# Patient Record
Sex: Female | Born: 1990 | Race: White | Hispanic: No | Marital: Single | Smoking: Never smoker
Health system: Southern US, Community
[De-identification: ages and names within clinical notes are randomized; demographics above are authoritative.]

## PROBLEM LIST (undated history)

## (undated) DIAGNOSIS — E079 Disorder of thyroid, unspecified: Secondary | ICD-10-CM

---
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
## 2008-01-31 ENCOUNTER — Ambulatory Visit: Payer: Self-pay | Admitting: Unknown Physician Specialty

## 2008-03-27 ENCOUNTER — Emergency Department: Payer: Self-pay | Admitting: Emergency Medicine

## 2008-05-01 ENCOUNTER — Ambulatory Visit: Payer: Self-pay | Admitting: Unknown Physician Specialty

## 2008-07-10 ENCOUNTER — Ambulatory Visit: Payer: Self-pay | Admitting: Unknown Physician Specialty

## 2008-08-13 ENCOUNTER — Ambulatory Visit: Payer: Self-pay | Admitting: Internal Medicine

## 2008-08-24 ENCOUNTER — Ambulatory Visit: Payer: Self-pay | Admitting: Internal Medicine

## 2008-08-24 ENCOUNTER — Ambulatory Visit: Payer: Self-pay | Admitting: Unknown Physician Specialty

## 2008-10-16 ENCOUNTER — Ambulatory Visit: Payer: Self-pay | Admitting: Unknown Physician Specialty

## 2008-12-20 ENCOUNTER — Ambulatory Visit: Payer: Self-pay | Admitting: Unknown Physician Specialty

## 2009-04-17 ENCOUNTER — Other Ambulatory Visit: Payer: Self-pay | Admitting: Unknown Physician Specialty

## 2009-10-29 ENCOUNTER — Other Ambulatory Visit: Payer: Self-pay | Admitting: Unknown Physician Specialty

## 2010-05-08 ENCOUNTER — Other Ambulatory Visit: Payer: Self-pay | Admitting: Unknown Physician Specialty

## 2010-07-05 ENCOUNTER — Ambulatory Visit: Payer: Self-pay | Admitting: Internal Medicine

## 2010-07-30 ENCOUNTER — Ambulatory Visit: Payer: Self-pay

## 2010-08-28 ENCOUNTER — Ambulatory Visit: Payer: Self-pay | Admitting: Family Medicine

## 2010-11-05 ENCOUNTER — Other Ambulatory Visit: Payer: Self-pay | Admitting: Unknown Physician Specialty

## 2011-08-25 ENCOUNTER — Other Ambulatory Visit: Payer: Self-pay

## 2011-08-25 LAB — TSH: Thyroid Stimulating Horm: 1.28 u[IU]/mL

## 2011-08-25 LAB — T4, FREE: Free Thyroxine: 1.25 ng/dL (ref 0.76–1.46)

## 2011-09-27 IMAGING — CR DG LUMBAR SPINE 2-3V
1 series · 3 of 3 positions shown · non-contrast
Comparison: none

REASON FOR EXAM: Dx back pain
COMMENTS:

PROCEDURE:     DXR - DXR LUMBAR SPINE AP AND LATERAL  - July 30, 2010 [DATE]
RESULT:     The vertebral body heights and the intervertebral disc spaces
are well maintained. The vertebral body alignment is normal. The pedicles
are bilaterally intact.

[Series 1: view not recorded · 0.17mm/px · 3 of 3 slices shown]
[im 1/3]
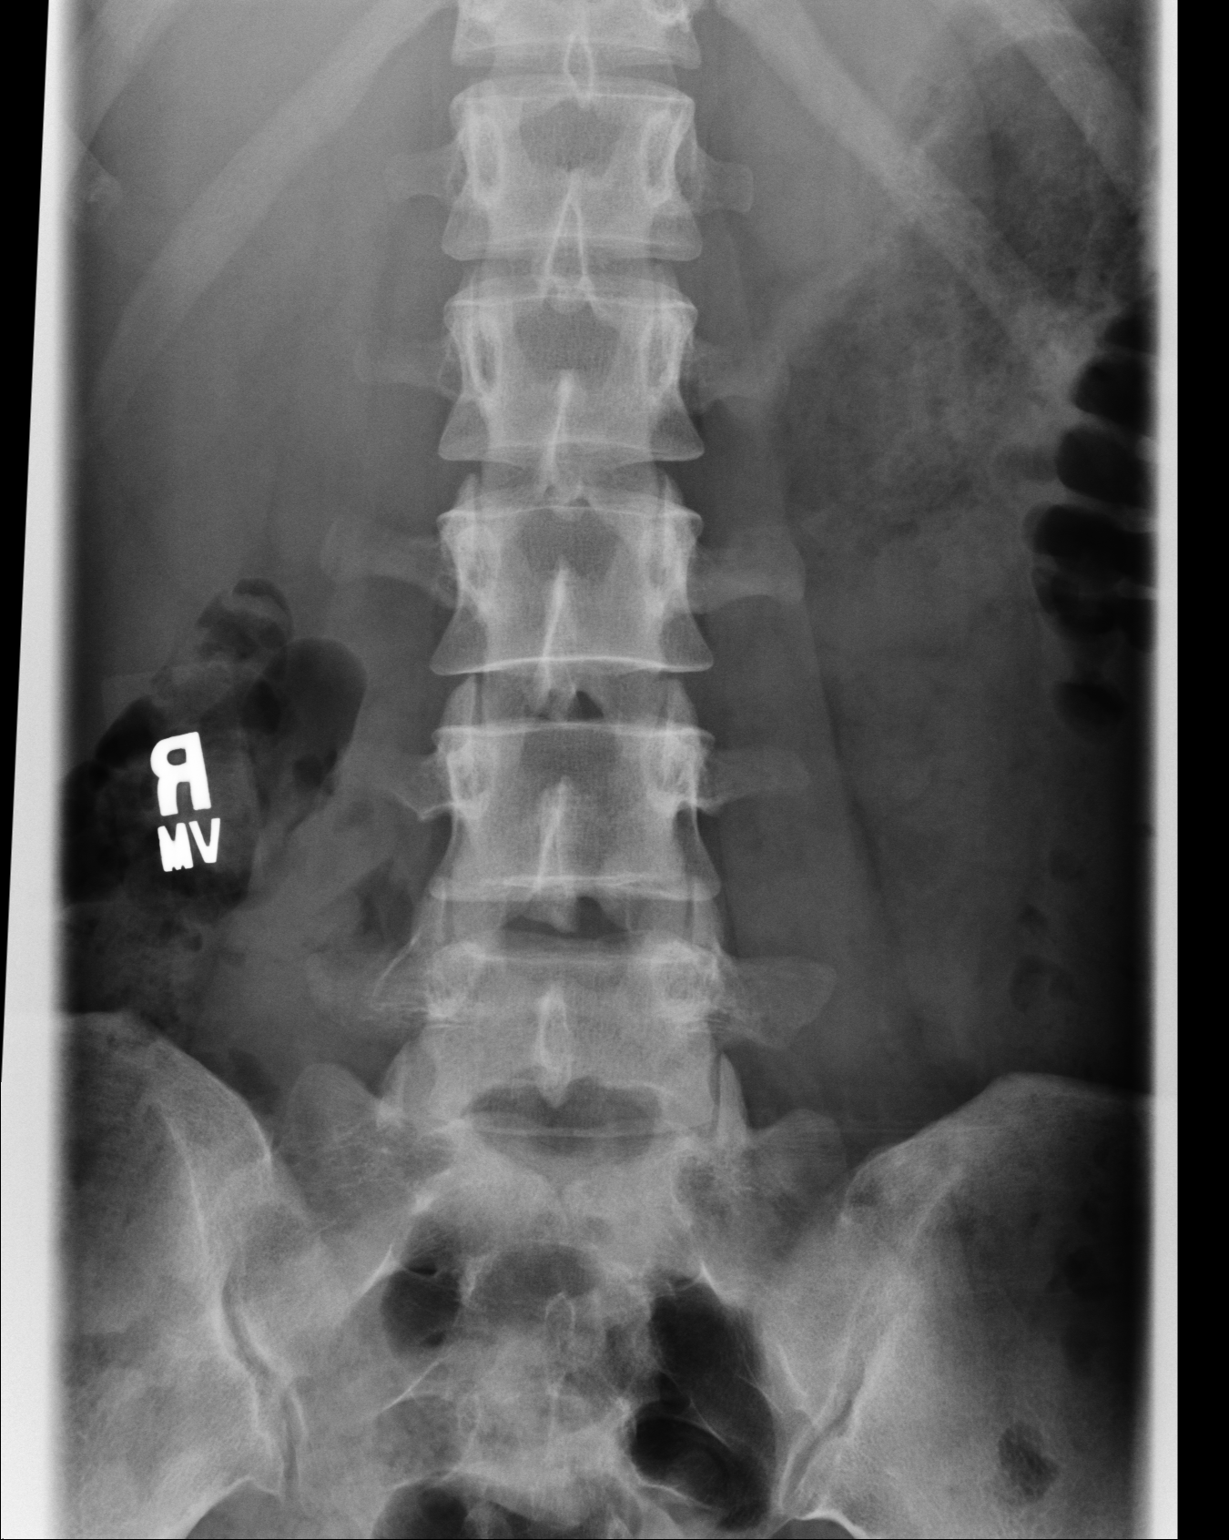
[im 2/3]
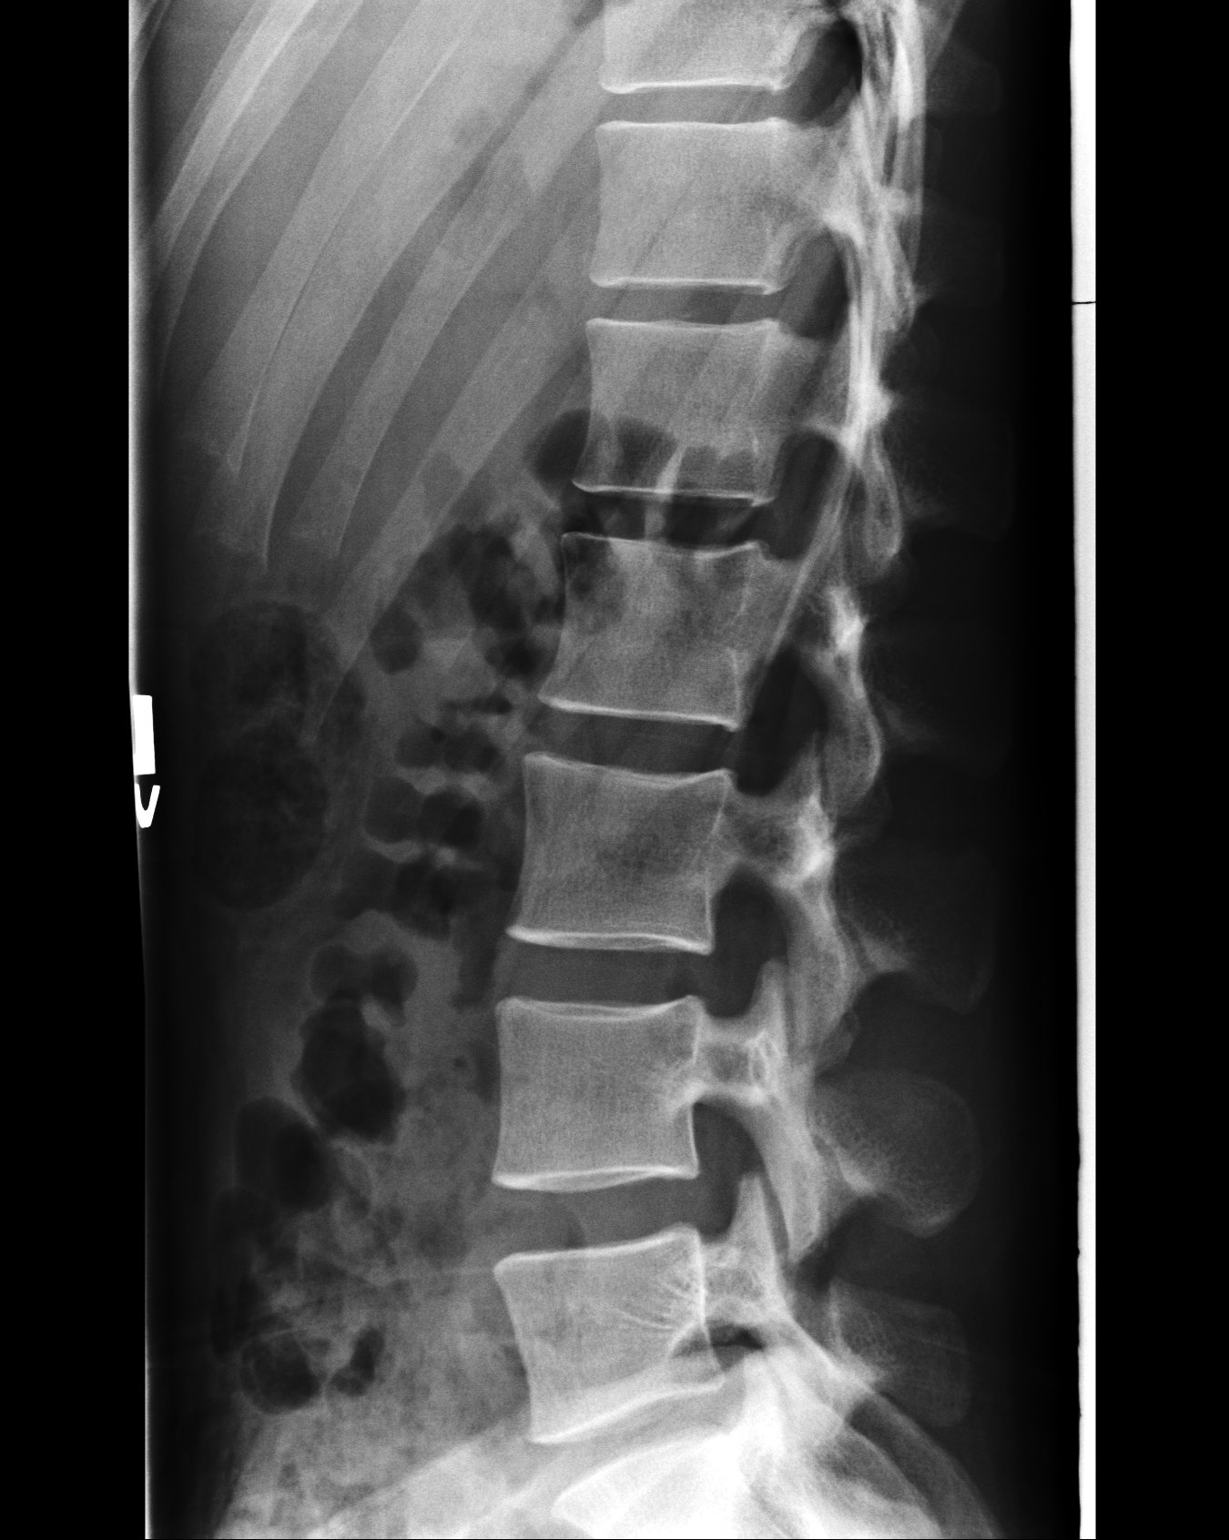
[im 3/3]
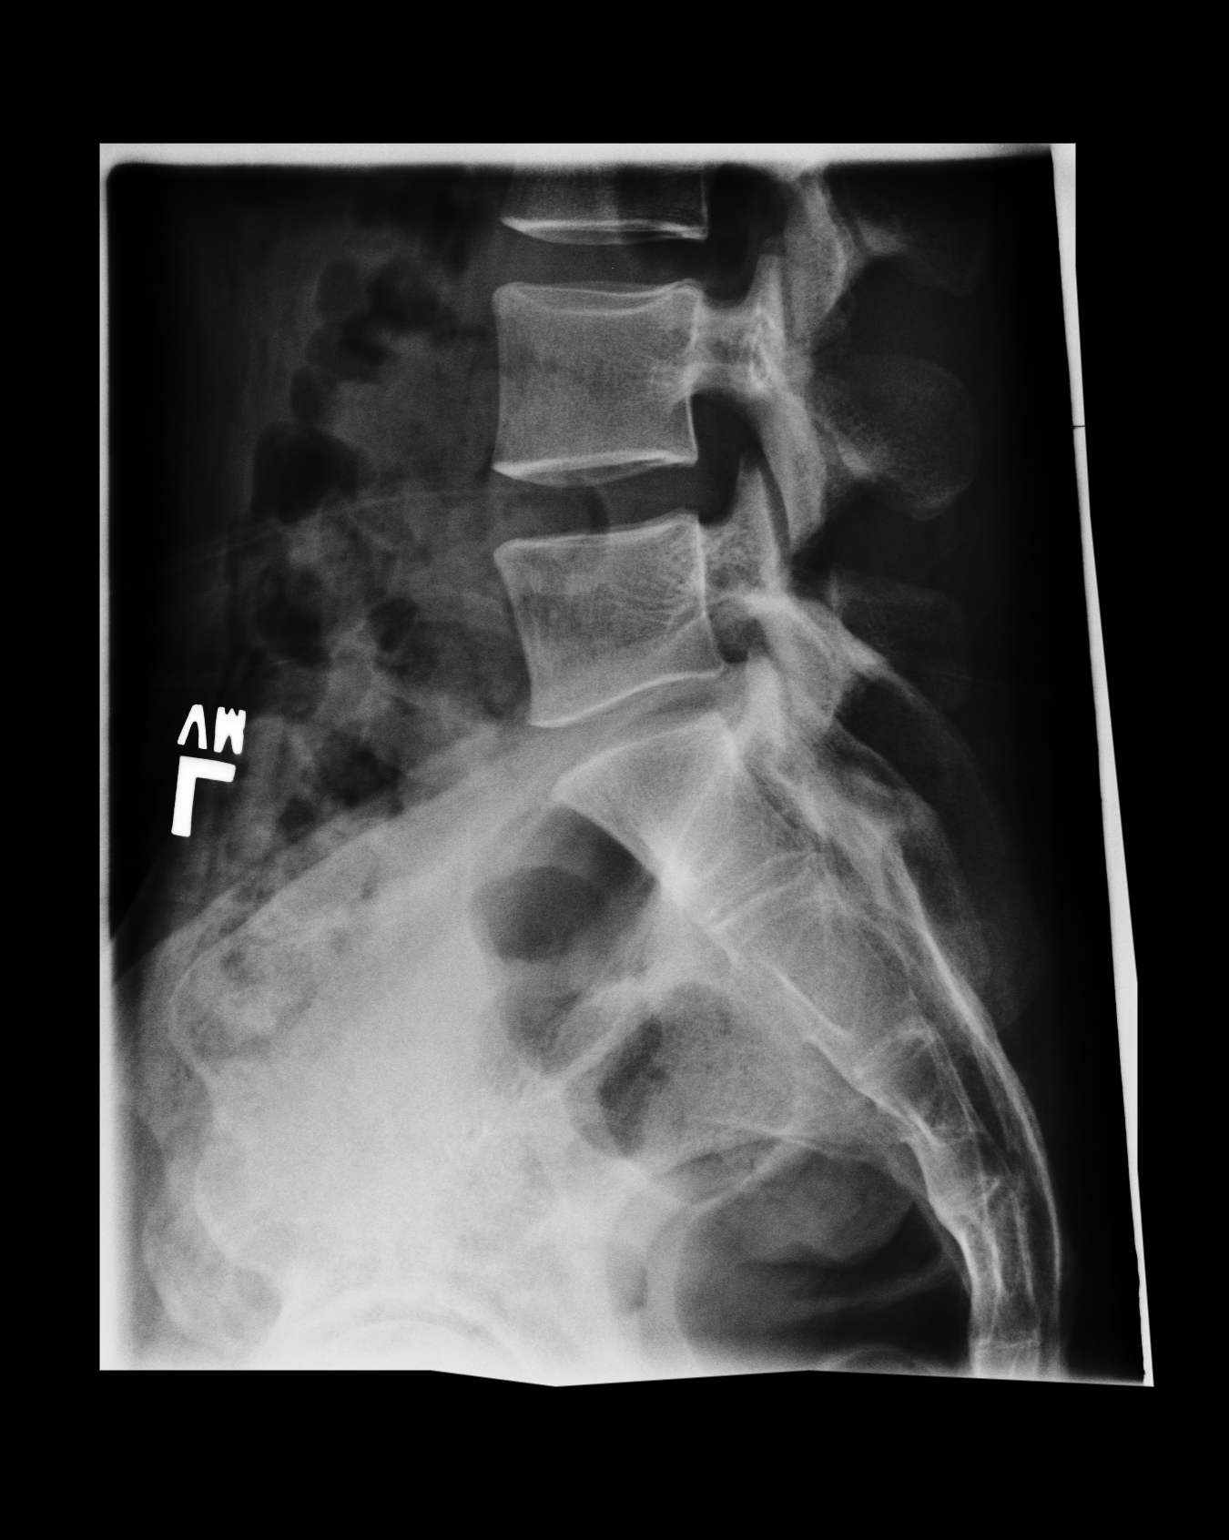

[3 of 3 positions shown; findings below may reference images not displayed]

IMPRESSION: No acute changes are identified.

## 2012-03-30 ENCOUNTER — Ambulatory Visit: Payer: Self-pay | Admitting: Family Medicine

## 2012-04-12 ENCOUNTER — Ambulatory Visit: Payer: Self-pay | Admitting: Internal Medicine

## 2013-02-03 ENCOUNTER — Other Ambulatory Visit: Payer: Self-pay | Admitting: Internal Medicine

## 2013-02-03 LAB — LIPID PANEL
Cholesterol: 184 mg/dL (ref 0–200)
Ldl Cholesterol, Calc: 126 mg/dL — ABNORMAL HIGH (ref 0–100)
VLDL Cholesterol, Calc: 12 mg/dL (ref 5–40)

## 2013-02-03 LAB — COMPREHENSIVE METABOLIC PANEL
Alkaline Phosphatase: 72 U/L (ref 50–136)
BUN: 11 mg/dL (ref 7–18)
Bilirubin,Total: 0.3 mg/dL (ref 0.2–1.0)
Calcium, Total: 8.7 mg/dL (ref 8.5–10.1)
Chloride: 107 mmol/L (ref 98–107)
Co2: 28 mmol/L (ref 21–32)
Creatinine: 0.83 mg/dL (ref 0.60–1.30)
EGFR (Non-African Amer.): >60
Glucose: 77 mg/dL (ref 65–99)
Osmolality: 276 (ref 275–301)
Potassium: 3.9 mmol/L (ref 3.5–5.1)
SGOT(AST): 16 U/L (ref 15–37)
Sodium: 139 mmol/L (ref 136–145)
Total Protein: 7.4 g/dL (ref 6.4–8.2)

## 2013-02-03 LAB — URINALYSIS, COMPLETE
Bilirubin,UR: NEGATIVE
Blood: NEGATIVE
Hyaline Cast: 3
Ketone: NEGATIVE
Leukocyte Esterase: NEGATIVE
Nitrite: NEGATIVE
Protein: NEGATIVE
RBC,UR: 8 /HPF (ref 0–5)
Specific Gravity: 1.021 (ref 1.003–1.030)
Squamous Epithelial: 2

## 2013-02-03 LAB — TSH: Thyroid Stimulating Horm: 1.13 u[IU]/mL

## 2013-02-03 LAB — CBC WITH DIFFERENTIAL/PLATELET
Basophil %: 0.7 %
Eosinophil #: 0.1 10*3/uL (ref 0.0–0.7)
Eosinophil %: 2.3 %
HGB: 13.1 g/dL (ref 12.0–16.0)
Lymphocyte #: 1.6 10*3/uL (ref 1.0–3.6)
MCHC: 33.9 g/dL (ref 32.0–36.0)
Monocyte #: 0.6 x10 3/mm (ref 0.2–0.9)
Monocyte %: 9 %
RDW: 14 % (ref 11.5–14.5)
WBC: 6.3 10*3/uL (ref 3.6–11.0)

## 2014-03-19 ENCOUNTER — Ambulatory Visit: Payer: Self-pay | Admitting: Obstetrics and Gynecology

## 2015-03-25 ENCOUNTER — Emergency Department (HOSPITAL_COMMUNITY): Payer: No Typology Code available for payment source

## 2015-03-25 ENCOUNTER — Emergency Department (HOSPITAL_COMMUNITY)
Admission: EM | Admit: 2015-03-25 | Discharge: 2015-03-25 | Disposition: A | Discharge status: Home / Self Care | Payer: No Typology Code available for payment source | Attending: Emergency Medicine | Admitting: Emergency Medicine

## 2015-03-25 ENCOUNTER — Encounter (HOSPITAL_COMMUNITY): Payer: Self-pay | Admitting: Emergency Medicine

## 2015-03-25 DIAGNOSIS — Y9389 Activity, other specified: Secondary | ICD-10-CM | POA: Diagnosis not present

## 2015-03-25 DIAGNOSIS — Y998 Other external cause status: Secondary | ICD-10-CM | POA: Diagnosis not present

## 2015-03-25 DIAGNOSIS — Z3202 Encounter for pregnancy test, result negative: Secondary | ICD-10-CM | POA: Insufficient documentation

## 2015-03-25 DIAGNOSIS — S20211A Contusion of right front wall of thorax, initial encounter: Secondary | ICD-10-CM | POA: Diagnosis not present

## 2015-03-25 DIAGNOSIS — Y92410 Unspecified street and highway as the place of occurrence of the external cause: Secondary | ICD-10-CM | POA: Insufficient documentation

## 2015-03-25 DIAGNOSIS — S29001A Unspecified injury of muscle and tendon of front wall of thorax, initial encounter: Secondary | ICD-10-CM | POA: Diagnosis present

## 2015-03-25 HISTORY — DX: Disorder of thyroid, unspecified: E07.9

## 2015-03-25 LAB — CBC
HCT: 41.4 % (ref 36.0–46.0)
Hemoglobin: 14.3 g/dL (ref 12.0–15.0)
MCH: 28.9 pg (ref 26.0–34.0)
MCHC: 34.5 g/dL (ref 30.0–36.0)
MCV: 83.6 fL (ref 78.0–100.0)
Platelets: 414 10*3/uL — ABNORMAL HIGH (ref 150–400)
RBC: 4.95 MIL/uL (ref 3.87–5.11)
RDW: 12.8 % (ref 11.5–15.5)
WBC: 7.5 10*3/uL (ref 4.0–10.5)

## 2015-03-25 LAB — COMPREHENSIVE METABOLIC PANEL
ALBUMIN: 3.6 g/dL (ref 3.5–5.0)
ALK PHOS: 92 U/L (ref 38–126)
ALT: 34 U/L (ref 14–54)
AST: 47 U/L — AB (ref 15–41)
Anion gap: 14 (ref 5–15)
BILIRUBIN TOTAL: 0.9 mg/dL (ref 0.3–1.2)
BUN: 12 mg/dL (ref 6–20)
CHLORIDE: 101 mmol/L (ref 101–111)
CO2: 20 mmol/L — ABNORMAL LOW (ref 22–32)
CREATININE: 1.09 mg/dL — AB (ref 0.44–1.00)
Calcium: 9.1 mg/dL (ref 8.9–10.3)
GFR calc Af Amer: >60 mL/min (ref >60)
Glucose, Bld: 103 mg/dL — ABNORMAL HIGH (ref 65–99)
Potassium: 4.4 mmol/L (ref 3.5–5.1)
Sodium: 135 mmol/L (ref 135–145)
Total Protein: 7.4 g/dL (ref 6.5–8.1)

## 2015-03-25 LAB — POC URINE PREG, ED: PREG TEST UR: NEGATIVE

## 2015-03-25 MED ORDER — MORPHINE SULFATE 4 MG/ML IJ SOLN
4.0000 mg | Freq: Once | INTRAMUSCULAR | Status: AC
  Administered 2015-03-25: 4 mg via INTRAVENOUS
  Filled 2015-03-25: qty 1

## 2015-03-25 MED ORDER — HYDROCODONE-ACETAMINOPHEN 5-325 MG PO TABS: 2.0000 | ORAL_TABLET | ORAL | Status: AC | PRN

## 2015-03-25 MED ORDER — ONDANSETRON HCL 4 MG/2ML IJ SOLN
4.0000 mg | Freq: Once | INTRAMUSCULAR | Status: AC
  Administered 2015-03-25: 4 mg via INTRAVENOUS
  Filled 2015-03-25: qty 2

## 2015-03-25 MED ORDER — IOHEXOL 300 MG/ML  SOLN
100.0000 mL | Freq: Once | INTRAMUSCULAR | Status: AC | PRN
  Administered 2015-03-25: 100 mL via INTRAVENOUS
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
## 2015-03-25 NOTE — ED Notes (Signed)
Pt transported to cT  °
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
## 2019-02-06 ENCOUNTER — Other Ambulatory Visit: Payer: Self-pay

## 2019-02-06 ENCOUNTER — Other Ambulatory Visit: Payer: Commercial Managed Care - HMO

## 2019-02-06 DIAGNOSIS — Z20822 Contact with and (suspected) exposure to covid-19: Secondary | ICD-10-CM

## 2019-02-10 LAB — NOVEL CORONAVIRUS, NAA: SARS-CoV-2, NAA: NOT DETECTED

## 2019-07-10 ENCOUNTER — Other Ambulatory Visit: Payer: Self-pay | Admitting: *Deleted

## 2019-07-10 DIAGNOSIS — Z20822 Contact with and (suspected) exposure to covid-19: Secondary | ICD-10-CM

## 2019-07-11 LAB — NOVEL CORONAVIRUS, NAA: SARS-CoV-2, NAA: NOT DETECTED

## 2023-04-24 ENCOUNTER — Other Ambulatory Visit: Payer: Self-pay

## 2023-04-24 ENCOUNTER — Emergency Department
Admission: EM | Admit: 2023-04-24 | Discharge: 2023-04-24 | Disposition: A | Discharge status: Home / Self Care | Payer: Managed Care, Other (non HMO) | Attending: Emergency Medicine | Admitting: Emergency Medicine

## 2023-04-24 DIAGNOSIS — R112 Nausea with vomiting, unspecified: Secondary | ICD-10-CM | POA: Diagnosis not present

## 2023-04-24 DIAGNOSIS — R197 Diarrhea, unspecified: Secondary | ICD-10-CM | POA: Insufficient documentation

## 2023-04-24 DIAGNOSIS — R1013 Epigastric pain: Secondary | ICD-10-CM | POA: Diagnosis present

## 2023-04-24 LAB — CBC
HCT: 45.9 % (ref 36.0–46.0)
Hemoglobin: 15.2 g/dL — ABNORMAL HIGH (ref 12.0–15.0)
MCH: 28.2 pg (ref 26.0–34.0)
MCHC: 33.1 g/dL (ref 30.0–36.0)
MCV: 85.2 fL (ref 80.0–100.0)
Platelets: 485 10*3/uL — ABNORMAL HIGH (ref 150–400)
RBC: 5.39 MIL/uL — ABNORMAL HIGH (ref 3.87–5.11)
RDW: 13 % (ref 11.5–15.5)
WBC: 11.7 10*3/uL — ABNORMAL HIGH (ref 4.0–10.5)
nRBC: 0 % (ref 0.0–0.2)

## 2023-04-24 LAB — URINALYSIS, ROUTINE W REFLEX MICROSCOPIC
Bilirubin Urine: NEGATIVE
Glucose, UA: NEGATIVE mg/dL
Hgb urine dipstick: NEGATIVE
Ketones, ur: NEGATIVE mg/dL
Nitrite: NEGATIVE
Protein, ur: 30 mg/dL — AB
Specific Gravity, Urine: 1.028 (ref 1.005–1.030)
pH: 5 (ref 5.0–8.0)

## 2023-04-24 LAB — COMPREHENSIVE METABOLIC PANEL
ALT: 34 U/L (ref 0–44)
AST: 28 U/L (ref 15–41)
Albumin: 4.1 g/dL (ref 3.5–5.0)
Alkaline Phosphatase: 80 U/L (ref 38–126)
Anion gap: 15 (ref 5–15)
BUN: 9 mg/dL (ref 6–20)
CO2: 20 mmol/L — ABNORMAL LOW (ref 22–32)
Calcium: 9.7 mg/dL (ref 8.9–10.3)
Chloride: 101 mmol/L (ref 98–111)
Creatinine, Ser: 1.07 mg/dL — ABNORMAL HIGH (ref 0.44–1.00)
GFR, Estimated: >60 mL/min (ref >60)
Glucose, Bld: 116 mg/dL — ABNORMAL HIGH (ref 70–99)
Potassium: 4 mmol/L (ref 3.5–5.1)
Sodium: 136 mmol/L (ref 135–145)
Total Bilirubin: 0.8 mg/dL (ref 0.3–1.2)
Total Protein: 8 g/dL (ref 6.5–8.1)

## 2023-04-24 LAB — POC URINE PREG, ED: Preg Test, Ur: NEGATIVE

## 2023-04-24 LAB — LIPASE, BLOOD: Lipase: 25 U/L (ref 11–51)

## 2023-04-24 MED ORDER — ONDANSETRON 8 MG PO TBDP: 8.0000 mg | ORAL_TABLET | Freq: Three times a day (TID) | ORAL | 0 refills | Status: AC | PRN

## 2023-04-24 MED ORDER — FAMOTIDINE 40 MG PO TABS: 40.0000 mg | ORAL_TABLET | Freq: Every evening | ORAL | 0 refills | Status: AC

## 2023-04-24 MED ORDER — SODIUM CHLORIDE 0.9 % IV BOLUS
1000.0000 mL | Freq: Once | INTRAVENOUS | Status: AC
  Administered 2023-04-24: 1000 mL via INTRAVENOUS

## 2023-04-24 MED ORDER — SODIUM CHLORIDE 0.9 % IV SOLN
8.0000 mg | Freq: Once | INTRAVENOUS | Status: AC
  Administered 2023-04-24: 8 mg via INTRAVENOUS
  Filled 2023-04-24: qty 4
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
## 2023-04-24 NOTE — ED Provider Notes (Signed)
[facility] Provider Note   Event Date/Time   First MD Initiated Contact with Patient 04/24/23 1937     (approximate) History  Abdominal Pain and Emesis  HPI Hayley Nguyen is a 32 y.o. female with a past medical history of thyroid disease who presents complaining of nausea/vomiting/diarrhea with associated midepigastric abdominal pain over the last 2 days.  Patient denies any recent travel or sick contacts.  Patient states she has been p.o. intolerant of the last 2 days.  Patient denies any blood in emesis or stool. ROS: Patient currently denies any vision changes, tinnitus, difficulty speaking, facial droop, sore throat, chest pain, shortness of breath,  dysuria, or weakness/numbness/paresthesias in any extremity   Physical Exam  Triage Vital Signs: ED Triage Vitals  Encounter Vitals Group     BP 04/24/23 1907 (!) 156/115     Systolic BP Percentile --      Diastolic BP Percentile --      Pulse Rate 04/24/23 1907 (!) 120     Resp 04/24/23 1907 (!) 22     Temp 04/24/23 1907 97.6 F (36.4 C)     Temp Source 04/24/23 1907 Oral     SpO2 04/24/23 1907 98 %     Weight 04/24/23 1906 265 lb (120.2 kg)     Height 04/24/23 1906 5\' 6"  (1.676 m)     Head Circumference --      Peak Flow --      Pain Score 04/24/23 1905 8     Pain Loc --      Pain Education --      Exclude from Growth Chart --    Most recent vital signs: Vitals:   04/24/23 2200 04/24/23 2230  BP: (!) 166/112 (!) 145/101  Pulse: 75 81  Resp:    Temp:    SpO2: 100% 100%   General: Awake, oriented x4. CV:  Good peripheral perfusion.  Resp:  Normal effort.  Abd:  No distention.  Other:  Middle-aged obese Caucasian female resting comfortably in no acute distress ED Results / Procedures / Treatments  Labs (all labs ordered are listed, but only abnormal results are displayed) Labs Reviewed  COMPREHENSIVE METABOLIC PANEL - Abnormal; Notable for the following components:      Result Value    CO2 20 (*)    Glucose, Bld 116 (*)    Creatinine, Ser 1.07 (*)    All other components within normal limits  CBC - Abnormal; Notable for the following components:   WBC 11.7 (*)    RBC 5.39 (*)    Hemoglobin 15.2 (*)    Platelets 485 (*)    All other components within normal limits  URINALYSIS, ROUTINE W REFLEX MICROSCOPIC - Abnormal; Notable for the following components:   Color, Urine AMBER (*)    APPearance CLOUDY (*)    Protein, ur 30 (*)    Leukocytes,Ua MODERATE (*)    Bacteria, UA MANY (*)    All other components within normal limits  LIPASE, BLOOD  POC URINE PREG, ED   PROCEDURES: Critical Care performed: No .1-3 Lead EKG Interpretation  Performed by: Merwyn Katos, MD Authorized by: Merwyn Katos, MD     Interpretation: normal     ECG rate:  71   ECG rate assessment: normal     Rhythm: sinus rhythm     Ectopy: none     Conduction: normal    MEDICATIONS ORDERED IN ED: Medications  sodium chloride 0.9 %  bolus 1,000 mL (0 mLs Intravenous Stopped 04/24/23 2241)  ondansetron (ZOFRAN) 8 mg in sodium chloride 0.9 % 50 mL IVPB (0 mg Intravenous Stopped 04/24/23 2241)   IMPRESSION / MDM / ASSESSMENT AND PLAN / ED COURSE  I reviewed the triage vital signs and the nursing notes.                             The patient is on the cardiac monitor to evaluate for evidence of arrhythmia and/or significant heart rate changes. Patient's presentation is most consistent with acute presentation with potential threat to life or bodily function. Patient presents for acute nausea/vomiting/diarrhea The cause of the patient's symptoms is not clear, but the patient is overall well appearing and is suspected to have a transient course of illness.  Given History and Exam there does not appear to be an emergent cause of the symptoms such as small bowel obstruction, coronary syndrome, bowel ischemia, DKA, pancreatitis, appendicitis, other acute abdomen or other emergent  problem.  Reassessment: After treatment, the patient is feeling much better, tolerating PO fluids, and shows no signs of dehydration.   Disposition: Discharge home with prompt primary care physician follow up in the next 48 hours. Strict return precautions discussed.   FINAL CLINICAL IMPRESSION(S) / ED DIAGNOSES   Final diagnoses:  Nausea vomiting and diarrhea  Epigastric pain   Rx / DC Orders   ED Discharge Orders          Ordered    ondansetron (ZOFRAN-ODT) 8 MG disintegrating tablet  Every 8 hours PRN        04/24/23 2133    famotidine (PEPCID) 40 MG tablet  Every evening        04/24/23 2133           Note:  This document was prepared using Dragon voice recognition software and may include unintentional dictation errors.   Merwyn Katos, MD 04/24/23 [phone]

## 2023-04-26 NOTE — Group Note (Deleted)

## 2023-11-11 DIAGNOSIS — M7551 Bursitis of right shoulder: Secondary | ICD-10-CM | POA: Diagnosis not present

## 2023-11-11 DIAGNOSIS — R5383 Other fatigue: Secondary | ICD-10-CM | POA: Diagnosis not present

## 2023-11-11 DIAGNOSIS — D510 Vitamin B12 deficiency anemia due to intrinsic factor deficiency: Secondary | ICD-10-CM | POA: Diagnosis not present

## 2024-07-14 DIAGNOSIS — D510 Vitamin B12 deficiency anemia due to intrinsic factor deficiency: Secondary | ICD-10-CM | POA: Diagnosis not present

## 2024-07-14 DIAGNOSIS — F331 Major depressive disorder, recurrent, moderate: Secondary | ICD-10-CM | POA: Diagnosis not present

## 2024-07-14 DIAGNOSIS — E079 Disorder of thyroid, unspecified: Secondary | ICD-10-CM | POA: Diagnosis not present

## 2024-07-14 DIAGNOSIS — K219 Gastro-esophageal reflux disease without esophagitis: Secondary | ICD-10-CM | POA: Diagnosis not present

## 2024-07-14 DIAGNOSIS — E785 Hyperlipidemia, unspecified: Secondary | ICD-10-CM | POA: Diagnosis not present

## 2024-08-02 DIAGNOSIS — Z113 Encounter for screening for infections with a predominantly sexual mode of transmission: Secondary | ICD-10-CM | POA: Diagnosis not present

## 2024-08-02 DIAGNOSIS — Z0000 Encounter for general adult medical examination without abnormal findings: Secondary | ICD-10-CM | POA: Diagnosis not present

## 2024-08-02 DIAGNOSIS — Z124 Encounter for screening for malignant neoplasm of cervix: Secondary | ICD-10-CM | POA: Diagnosis not present
